# Patient Record
Sex: Female | Born: 1940 | Race: Black or African American | Hispanic: No
Health system: Southern US, Community
[De-identification: ages and names within clinical notes are randomized; demographics above are authoritative.]

---

## 2004-09-21 ENCOUNTER — Emergency Department: Payer: Self-pay | Admitting: Emergency Medicine

## 2005-07-06 ENCOUNTER — Emergency Department: Payer: Self-pay | Admitting: Emergency Medicine

## 2005-11-03 ENCOUNTER — Ambulatory Visit: Payer: Self-pay | Admitting: Family Medicine

## 2007-04-20 ENCOUNTER — Ambulatory Visit: Payer: Self-pay | Admitting: Family Medicine

## 2007-04-27 ENCOUNTER — Ambulatory Visit: Payer: Self-pay | Admitting: Family Medicine

## 2007-06-27 ENCOUNTER — Ambulatory Visit: Payer: Self-pay | Admitting: Ophthalmology

## 2007-06-27 ENCOUNTER — Other Ambulatory Visit: Payer: Self-pay

## 2007-07-04 ENCOUNTER — Ambulatory Visit: Payer: Self-pay | Admitting: Ophthalmology

## 2007-07-13 ENCOUNTER — Ambulatory Visit: Payer: Self-pay | Admitting: Family Medicine

## 2007-08-29 ENCOUNTER — Ambulatory Visit: Payer: Self-pay | Admitting: Ophthalmology

## 2009-10-22 ENCOUNTER — Emergency Department: Payer: Self-pay | Admitting: Emergency Medicine

## 2010-10-22 ENCOUNTER — Emergency Department: Payer: Self-pay | Admitting: *Deleted

## 2012-01-28 ENCOUNTER — Emergency Department: Payer: Self-pay | Admitting: Emergency Medicine

## 2013-06-07 ENCOUNTER — Ambulatory Visit: Payer: Self-pay

## 2014-08-27 ENCOUNTER — Other Ambulatory Visit: Payer: Self-pay | Admitting: Family Medicine

## 2014-08-27 DIAGNOSIS — I272 Pulmonary hypertension, unspecified: Secondary | ICD-10-CM

## 2014-10-17 ENCOUNTER — Ambulatory Visit
Admission: RE | Admit: 2014-10-17 | Discharge: 2014-10-17 | Disposition: A | Discharge status: Home / Self Care | Payer: Medicare (Managed Care) | Source: Ambulatory Visit | Attending: Family Medicine | Admitting: Family Medicine

## 2014-10-17 DIAGNOSIS — R0600 Dyspnea, unspecified: Secondary | ICD-10-CM | POA: Diagnosis not present

## 2014-10-17 DIAGNOSIS — I272 Pulmonary hypertension, unspecified: Secondary | ICD-10-CM

## 2014-10-17 NOTE — Progress Notes (Signed)
*  PRELIMINARY RESULTS* Echocardiogram 2D Echocardiogram has been performed.  Garrel Ridgel Stills 10/17/2014, 10:36 AM

## 2018-05-24 ENCOUNTER — Other Ambulatory Visit: Payer: Self-pay | Admitting: Family Medicine

## 2018-05-24 DIAGNOSIS — M810 Age-related osteoporosis without current pathological fracture: Secondary | ICD-10-CM

## 2018-05-24 DIAGNOSIS — R296 Repeated falls: Secondary | ICD-10-CM

## 2019-01-13 ENCOUNTER — Other Ambulatory Visit: Payer: Self-pay | Admitting: Family Medicine

## 2019-01-13 DIAGNOSIS — R296 Repeated falls: Secondary | ICD-10-CM

## 2019-01-13 DIAGNOSIS — M810 Age-related osteoporosis without current pathological fracture: Secondary | ICD-10-CM

## 2019-01-31 ENCOUNTER — Ambulatory Visit
Admission: RE | Admit: 2019-01-31 | Discharge: 2019-01-31 | Disposition: A | Discharge status: Home / Self Care | Payer: Medicare (Managed Care) | Source: Ambulatory Visit | Attending: Family Medicine | Admitting: Family Medicine

## 2019-01-31 DIAGNOSIS — R296 Repeated falls: Secondary | ICD-10-CM | POA: Insufficient documentation

## 2019-01-31 DIAGNOSIS — M810 Age-related osteoporosis without current pathological fracture: Secondary | ICD-10-CM | POA: Insufficient documentation

## 2019-04-21 ENCOUNTER — Other Ambulatory Visit: Payer: Self-pay | Admitting: Family Medicine

## 2019-04-21 DIAGNOSIS — R05 Cough: Secondary | ICD-10-CM

## 2019-04-21 DIAGNOSIS — I5189 Other ill-defined heart diseases: Secondary | ICD-10-CM

## 2019-04-21 DIAGNOSIS — R053 Chronic cough: Secondary | ICD-10-CM

## 2019-04-21 DIAGNOSIS — I5022 Chronic systolic (congestive) heart failure: Secondary | ICD-10-CM

## 2019-05-12 ENCOUNTER — Other Ambulatory Visit: Payer: Self-pay

## 2019-05-12 ENCOUNTER — Ambulatory Visit
Admission: RE | Admit: 2019-05-12 | Discharge: 2019-05-12 | Disposition: A | Discharge status: Home / Self Care | Payer: Medicare (Managed Care) | Source: Ambulatory Visit | Attending: Family Medicine | Admitting: Family Medicine

## 2019-05-12 DIAGNOSIS — I5189 Other ill-defined heart diseases: Secondary | ICD-10-CM | POA: Diagnosis not present

## 2019-05-12 DIAGNOSIS — I5022 Chronic systolic (congestive) heart failure: Secondary | ICD-10-CM | POA: Insufficient documentation

## 2019-05-12 DIAGNOSIS — R05 Cough: Secondary | ICD-10-CM | POA: Diagnosis not present

## 2019-05-12 DIAGNOSIS — R053 Chronic cough: Secondary | ICD-10-CM

## 2019-05-12 NOTE — Progress Notes (Signed)
*  PRELIMINARY RESULTS* Echocardiogram 2D Echocardiogram has been performed.  Phyllis Oneill 05/12/2019, 11:45 AM

## 2019-06-07 ENCOUNTER — Other Ambulatory Visit: Payer: Self-pay | Admitting: Family Medicine

## 2019-06-07 DIAGNOSIS — R053 Chronic cough: Secondary | ICD-10-CM

## 2019-06-07 DIAGNOSIS — R05 Cough: Secondary | ICD-10-CM

## 2019-06-20 ENCOUNTER — Other Ambulatory Visit: Payer: Self-pay

## 2019-06-20 ENCOUNTER — Ambulatory Visit
Admission: RE | Admit: 2019-06-20 | Discharge: 2019-06-20 | Disposition: A | Discharge status: Home / Self Care | Payer: Medicare (Managed Care) | Source: Ambulatory Visit | Attending: Family Medicine | Admitting: Family Medicine

## 2019-06-20 DIAGNOSIS — R05 Cough: Secondary | ICD-10-CM | POA: Insufficient documentation

## 2019-06-20 DIAGNOSIS — R053 Chronic cough: Secondary | ICD-10-CM

## 2020-06-19 IMAGING — CT CT CHEST W/O CM
2 of 4 series · 15 of 36 positions shown, 18 images · non-contrast
Comparison: Chest radiograph dated 10/22/2010.

CLINICAL DATA: 78-year-old female with intermittent cough.

EXAM:
CT CHEST WITHOUT CONTRAST
TECHNIQUE: Multidetector CT imaging of the chest was performed following the
standard protocol without IV contrast.

[Series 2: chest 2.00 · axial · 0.56mm/px · z∈[-1181,-921]mm · 12 of 154 slices shown, 15 images]
[im 12/154  mediastinal]
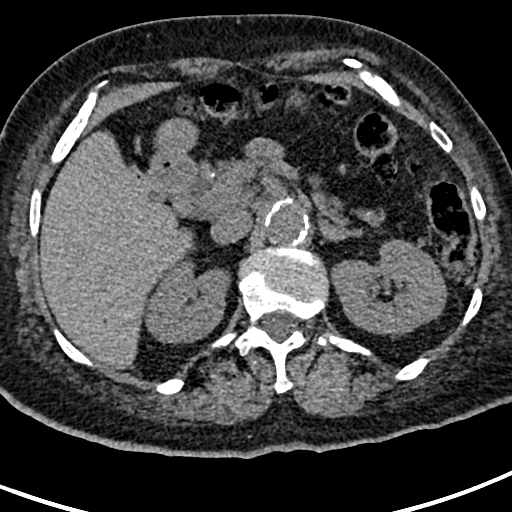
[im 12/154  lung]
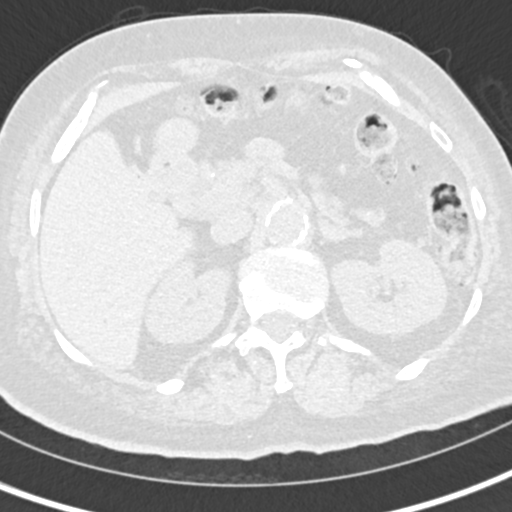
[im 24/154  lung]
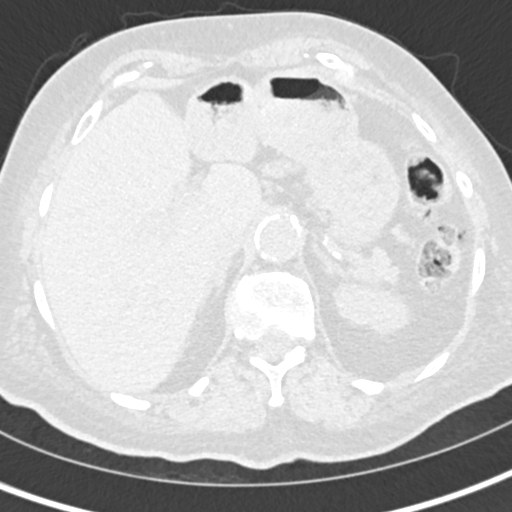
[im 36/154  lung]
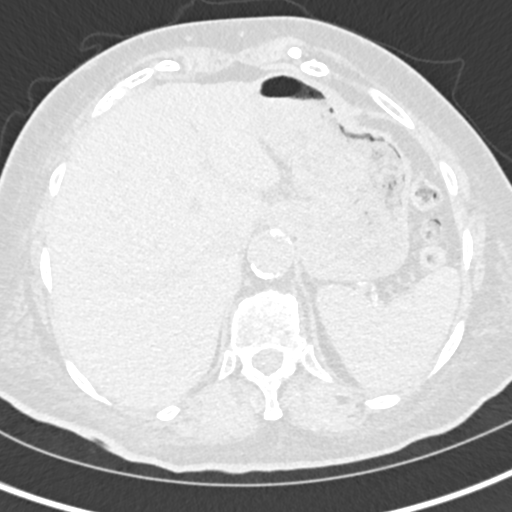
[im 48/154  lung]
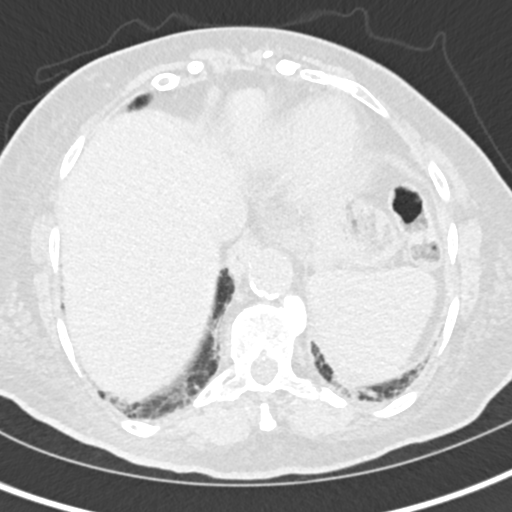
[im 59/154  mediastinal]
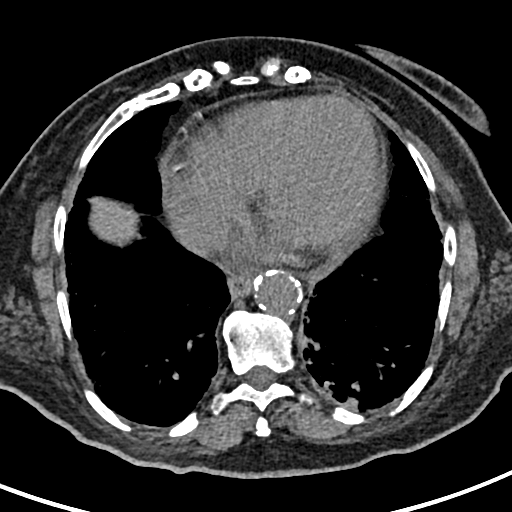
[im 59/154  lung]
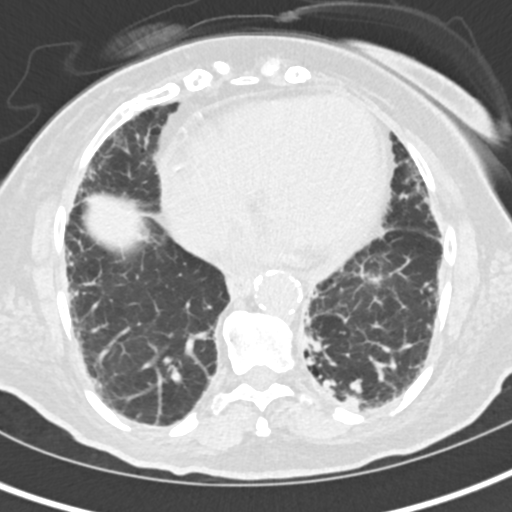
[im 71/154  lung]
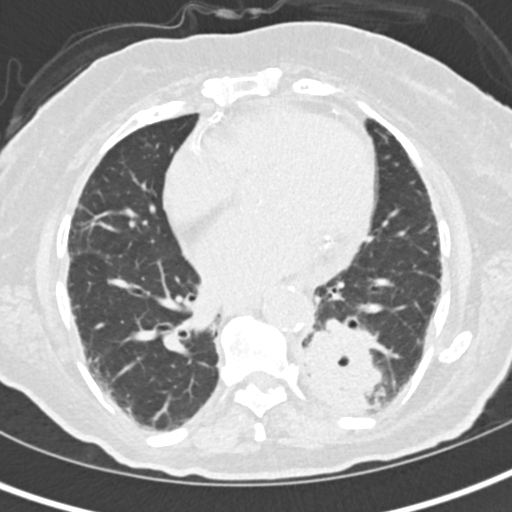
[im 83/154  lung]
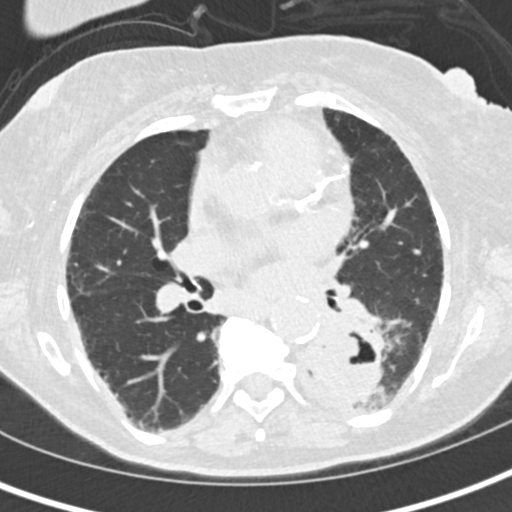
[im 95/154  lung]
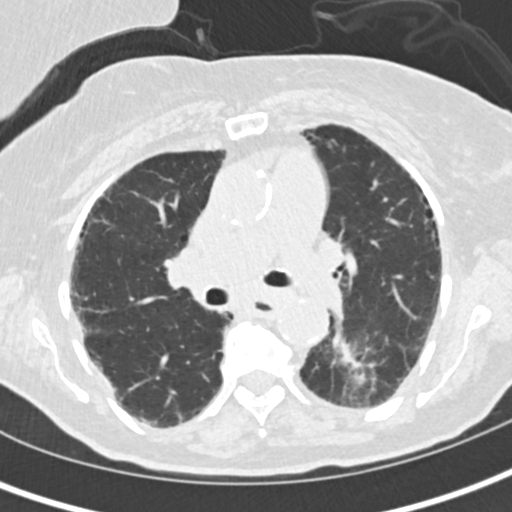
[im 106/154  mediastinal]
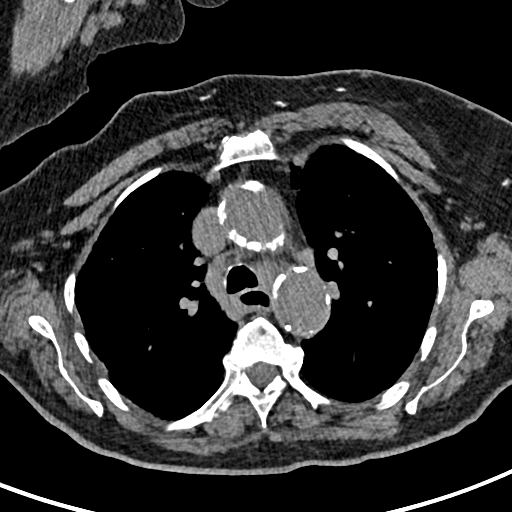
[im 106/154  lung]
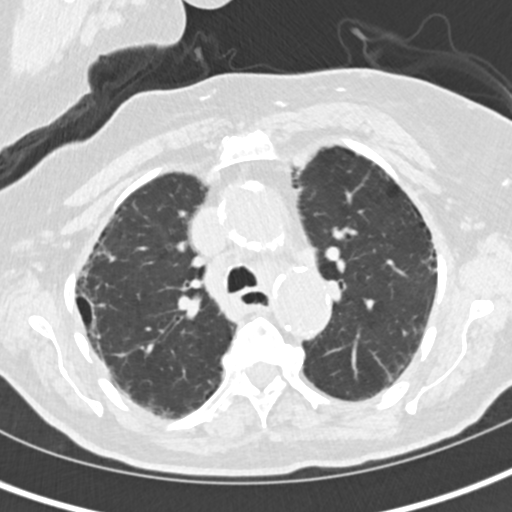
[im 118/154  lung]
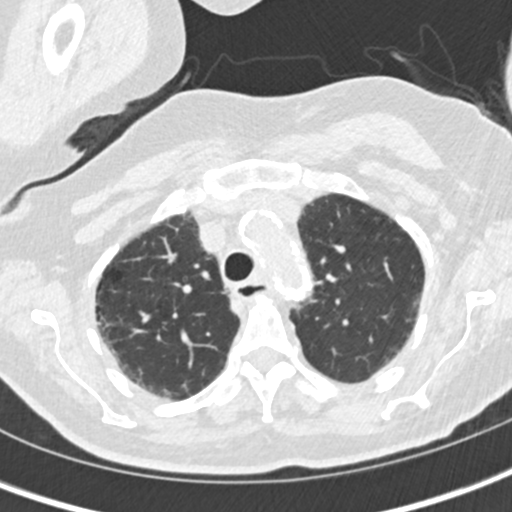
[im 130/154  lung]
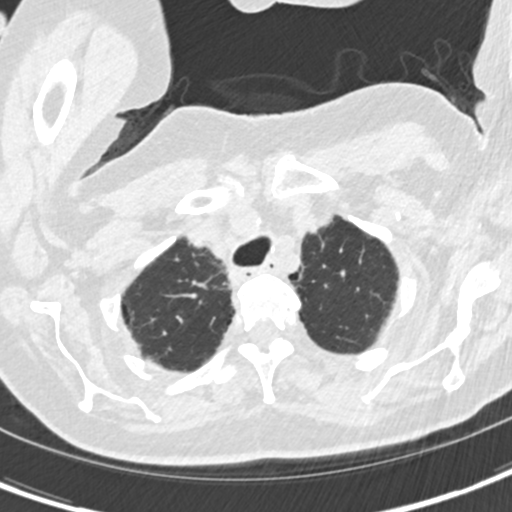
[im 142/154  lung]
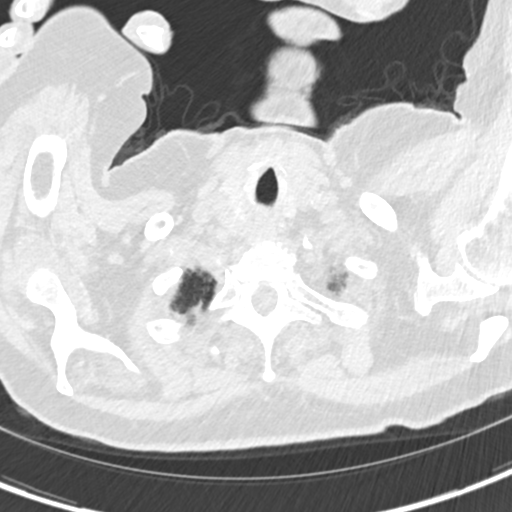

[Series 5: coronals chest 2.00 cor · coronal · 0.56mm/px · 3 of 131 slices shown]
[im 27/131  lung]
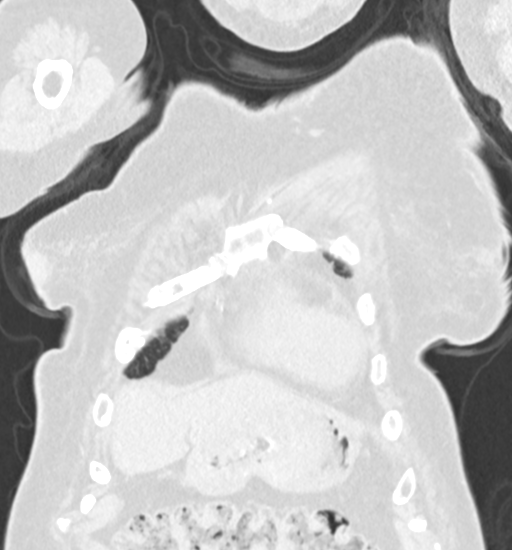
[im 53/131  lung]
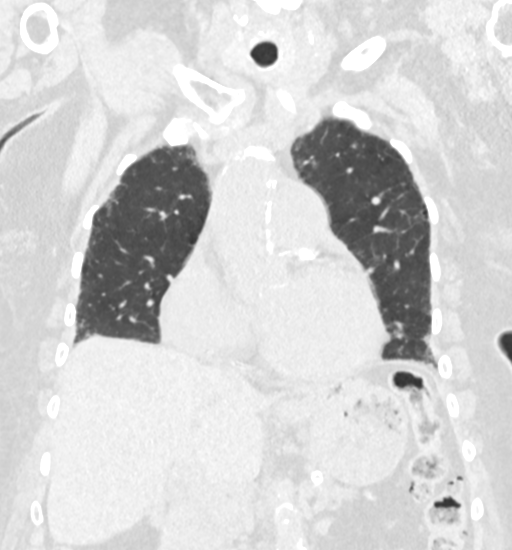
[im 79/131  lung]
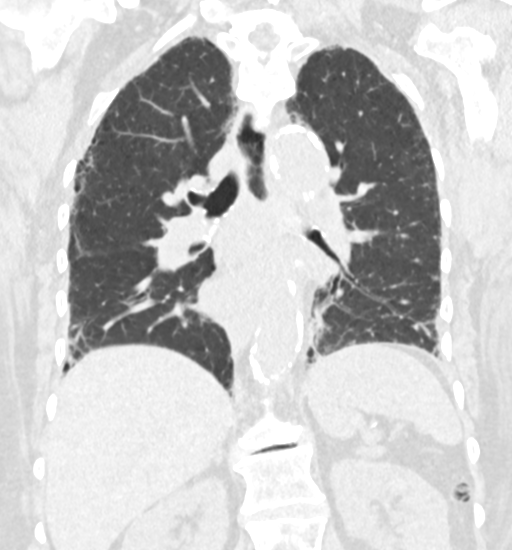

[15 of 36 positions shown; findings below may reference images not displayed]

FINDINGS: Evaluation of this exam is limited in the absence of intravenous
contrast.

Cardiovascular: Borderline cardiomegaly. Trace pericardial effusion
measuring 6 mm in thickness. Advanced 3 vessel coronary vascular
calcification. There is advanced atherosclerotic calcification of
the thoracic aorta. The central pulmonary arteries are grossly
unremarkable.

Mediastinum/Nodes: No definite hilar or mediastinal adenopathy.
Evaluation however is limited in the absence of intravenous
contrast. The esophagus is grossly unremarkable. No mediastinal
fluid collection.

Lungs/Pleura: There is a 5.7 x 5.0 cm left lower lobe cavitary mass.
This may represent a neoplasm or fungal infection, abscess, or less
likely TB. Clinical correlation is recommended. If there is no
clinical findings of infection further evaluation with bronchoscopy
may provide better evaluation. There is background of centrilobular
and paraseptal emphysema. No pleural effusion or pneumothorax. The
central airways are patent.

Upper Abdomen: Scattered colonic diverticula.

Musculoskeletal: Osteopenia with degenerative changes of the spine.
No acute osseous pathology.
IMPRESSION: 1. Left lower lobe cavitary mass as above. Clinical correlation is
recommended. If there is no clinical findings of infection further
evaluation with bronchoscopy may provide better evaluation.
2. Coronary vascular calcification.
3. Aortic Atherosclerosis ([email or web address].[email or web address]) and Emphysema ([id number].X).

## 2020-07-31 DEATH — deceased
